# Patient Record
Sex: Female | Born: 1985 | Race: Black or African American | Hispanic: No | Marital: Married | Smoking: Never smoker
Health system: Southern US, Community
[De-identification: ages and names within clinical notes are randomized; demographics above are authoritative.]

## PROBLEM LIST (undated history)

## (undated) HISTORY — PX: TONSILLECTOMY: SUR1361

---

## 2012-12-15 ENCOUNTER — Encounter: Payer: Self-pay | Admitting: Obstetrics and Gynecology

## 2012-12-15 ENCOUNTER — Ambulatory Visit: Payer: BC Managed Care – PPO | Admitting: Obstetrics and Gynecology

## 2012-12-15 VITALS — BP 120/84 | HR 60 | Ht 68.25 in | Wt 185.0 lb

## 2012-12-15 DIAGNOSIS — Z124 Encounter for screening for malignant neoplasm of cervix: Secondary | ICD-10-CM

## 2012-12-15 DIAGNOSIS — Z113 Encounter for screening for infections with a predominantly sexual mode of transmission: Secondary | ICD-10-CM

## 2012-12-15 NOTE — Progress Notes (Signed)
Last Pap: 03/2011 per pt WNL: Pt unsure of results  Regular Periods:no Contraception: withdrawal   Monthly Breast exam:yes Tetanus<23yrs:yes Nl.Bladder Function:yes Daily BMs:yes Healthy Diet:yes Calcium:no Mammogram:no Date of Mammogram: n/a Exercise:yes Have often Exercise: walking daily  Seatbelt: yes Abuse at home: no Stressful work:no Sigmoid-colonoscopy: n/a Bone Density: No PCP: Pt does not have one Change in PMH: n/a Change in FMH:n/a BP 120/84  Pulse 60  Ht 5' 8.25" (1.734 m)  Wt 185 lb (83.915 kg)  BMI 27.92 kg/m2  LMP 09/14/2012 Pt with complaints:no and she is interested in [facility].  No VTE, GB or liver disease.  She used ocps as a teen without problems.  She has a menses q 3 months.  No menopausal sxs.    Physical Examination: General appearance - alert, well appearing, and in no distress Mental status - normal mood, behavior, speech, dress, motor activity, and thought processes Neck - supple, no significant adenopathy,  thyroid exam: thyroid is normal in size without nodules or tenderness Chest - clear to auscultation, no wheezes, rales or rhonchi, symmetric air entry Heart - normal rate and regular rhythm Abdomen - soft, nontender, nondistended, no masses or organomegaly Breasts - breasts appear normal, no suspicious masses, no skin or nipple changes or axillary nodes Pelvic - normal external genitalia, vulva, vagina, cervix, uterus and adnexa Rectal - rectal exam not indicated Back exam - full range of motion, no tenderness, palpable spasm or pain on motion Neurological - alert, oriented, normal speech, no focal findings or movement disorder noted Musculoskeletal - no joint tenderness, deformity or swelling Extremities - no edema, redness or tenderness in the calves or thighs Skin - normal coloration and turgor, no rashes, no suspicious skin lesions noted Routine exam Pap sent yes Mammogram due no pt desires continuous BC.  sesonique used for contraception.   Verbal and written instrucitons given RT 4 months

## 2012-12-16 LAB — HIV ANTIBODY (ROUTINE TESTING W REFLEX): HIV: NONREACTIVE

## 2012-12-17 LAB — PAP IG, CT-NG, RFX HPV ASCU: Chlamydia Probe Amp: NEGATIVE

## 2012-12-23 ENCOUNTER — Telehealth: Payer: Self-pay

## 2012-12-23 NOTE — Telephone Encounter (Signed)
Message copied by Rolla Plate on Wed Dec 23, 2012 10:05 AM ------      Message from: Jaymes Graff      Created: Wed Dec 23, 2012 12:33 AM       Please schedule pt for colposcopy. ------

## 2012-12-23 NOTE — Telephone Encounter (Signed)
Spoke with pt rgd labs informed lab results informed pap showed abnl pap need colpo pt has appt 01/06/13 at 2:45 with nd pt voice understanding

## 2013-01-06 ENCOUNTER — Encounter: Payer: BC Managed Care – PPO | Admitting: Obstetrics and Gynecology

## 2013-01-06 ENCOUNTER — Telehealth: Payer: Self-pay | Admitting: Obstetrics and Gynecology

## 2013-01-06 NOTE — Telephone Encounter (Signed)
Forwarded to appts.

## 2013-02-01 ENCOUNTER — Telehealth: Payer: Self-pay

## 2013-02-01 NOTE — Telephone Encounter (Signed)
Message copied by Rolla Plate on Mon Feb 01, 2013 12:20 PM ------      Message from: Jaymes Graff      Created: Mon Feb 01, 2013  9:39 AM       Please schedule pt for colposcopy. ------

## 2013-02-01 NOTE — Telephone Encounter (Signed)
Try calling pt rgd r/s colpo no answer unable to leave msg 

## 2013-02-02 NOTE — Telephone Encounter (Signed)
Try calling pt rgd r/s colpo and lab results no answer unable to leave voice mail

## 2013-02-03 ENCOUNTER — Telehealth: Payer: Self-pay

## 2013-02-03 NOTE — Telephone Encounter (Signed)
Try calling pt rgd r/s colpo no answer unable to leave msg

## 2013-12-22 ENCOUNTER — Emergency Department (HOSPITAL_COMMUNITY): Payer: BC Managed Care – PPO

## 2013-12-22 ENCOUNTER — Emergency Department (HOSPITAL_COMMUNITY)
Admission: EM | Admit: 2013-12-22 | Discharge: 2013-12-22 | Disposition: A | Discharge status: Home / Self Care | Payer: BC Managed Care – PPO | Attending: Emergency Medicine | Admitting: Emergency Medicine

## 2013-12-22 ENCOUNTER — Encounter (HOSPITAL_COMMUNITY): Payer: Self-pay | Admitting: Emergency Medicine

## 2013-12-22 DIAGNOSIS — J189 Pneumonia, unspecified organism: Secondary | ICD-10-CM

## 2013-12-22 DIAGNOSIS — J159 Unspecified bacterial pneumonia: Secondary | ICD-10-CM | POA: Insufficient documentation

## 2013-12-22 MED ORDER — LEVOFLOXACIN 500 MG PO TABS: 500.0000 mg | ORAL_TABLET | Freq: Every day | ORAL | Status: AC

## 2013-12-22 MED ORDER — HYDROCODONE-HOMATROPINE 5-1.5 MG/5ML PO SYRP: 5.0000 mL | ORAL_SOLUTION | Freq: Four times a day (QID) | ORAL | Status: AC | PRN

## 2013-12-22 NOTE — Discharge Instructions (Signed)
Take Levaquin as directed until gone. Take hycodan as needed for cough. Refer to attached documents for more information. Return to the ED with worsening or concerning symptoms.  °

## 2013-12-22 NOTE — ED Provider Notes (Signed)
CSN: [id number]     Arrival date & time 12/22/13  1515 History  This chart was scribed for Yvonne Shadow, PA-C, working with Yvonne Manifold, MD by Maree Erie, ED Scribe. This patient was seen in room [employer] and the patient's care was started at 3:56 PM.    Chief Complaint  Patient presents with  . Cough    Patient is a 28 y.o. female presenting with cough. The history is provided by the patient. No language interpreter was used.  Cough Associated symptoms: chills, fever and shortness of breath   Associated symptoms: no sore throat     HPI Comments: Yvonne Sanchez is a 28 y.o. female who presents to the Emergency Department complaining of a intermittent coughing spells that began a week ago. She states that she has associated subjective fever, chills and shortness of breath with the coughing spells. She has been using Mucinex and Vick's at home with mild relief. She denies nausea, vomiting, diarrhea, or sore throat. She states that she works at an [employer] so she has been around a lot of sick contacts.   History reviewed. No pertinent past medical history. Past Surgical History  Procedure Laterality Date  . Tonsillectomy     Family History  Problem Relation Age of Onset  . Ovarian cysts Mother    History  Substance Use Topics  . Smoking status: Never Smoker   . Smokeless tobacco: Not on file  . Alcohol Use: No   OB History   Grav Para Term Preterm Abortions TAB SAB Ect Mult Living   0 0 0 0 0 0 0 0 0 0      Review of Systems  Constitutional: Positive for fever and chills.  HENT: Negative for sore throat.   Respiratory: Positive for cough and shortness of breath.   Gastrointestinal: Negative for nausea, vomiting and diarrhea.  All other systems reviewed and are negative.      Allergies  Review of patient's allergies indicates no known allergies.  Home Medications  No current outpatient prescriptions on file. Triage Vitals: BP 115/84  Pulse 95   Temp(Src) 99.1 F (37.3 C) (Oral)  Resp 20  SpO2 94%  Physical Exam  Nursing note and vitals reviewed. Constitutional: She is oriented to person, place, and time. She appears well-developed and well-nourished. No distress.  HENT:  Head: Normocephalic and atraumatic.  Eyes: EOM are normal.  Neck: Neck supple. No tracheal deviation present.  Cardiovascular: Normal rate, regular rhythm and normal heart sounds.   No murmur heard. Pulmonary/Chest: Effort normal and breath sounds normal. No respiratory distress. She has no wheezes. She has no rales.  Abdominal: Soft. She exhibits no distension. There is no tenderness.  Musculoskeletal: Normal range of motion.  Neurological: She is alert and oriented to person, place, and time.  Skin: Skin is warm and dry.  Psychiatric: She has a normal mood and affect. Her behavior is normal.    ED Course  Procedures (including critical care time)  DIAGNOSTIC STUDIES: Oxygen Saturation is 94% on room air, adequate by my interpretation.    COORDINATION OF CARE: 3:58 PM -Will order chest x-ray. Patient verbalizes understanding and agrees with treatment plan.    Labs Review Labs Reviewed - No data to display Imaging Review Dg Chest 2 View  12/22/2013   CLINICAL DATA:  Cough, chills, possible pneumonia  EXAM: CHEST  2 VIEW  COMPARISON:  None.  FINDINGS: Cardiomediastinal silhouette is unremarkable. No pulmonary edema. Data is streaky airspace disease left base retrocardiac  highly suspicious for infiltrate/ pneumonia. Follow-up to resolution is recommended. Mild thoracic dextroscoliosis.  IMPRESSION: Streaky airspace disease left base retrocardiac highly suspicious for infiltrate/ pneumonia. Follow-up to resolution after treatment is recommended.   Electronically Signed   By: Lahoma Crocker M.D.   On: 12/22/2013 16:17    EKG Interpretation   None       MDM   Final diagnoses:  CAP (community acquired pneumonia)    Patient's chest xray shows  infiltrate concerning for pneumonia. Patient will be treated with levaquin and hycodan. Vital stable and patient afebrile.   I personally performed the services described in this documentation, which was scribed in my presence. The recorded information has been reviewed and is accurate.    Alvina Chou, PA-C 12/23/13 1052

## 2013-12-22 NOTE — ED Notes (Signed)
Pt c/o cough, chills, shortness of breath for about one week.  Pt states she has had productive cough. Pt denies other symptoms. Pt with no acute distress. Skin warm, dry.

## 2013-12-27 NOTE — ED Provider Notes (Signed)
Medical screening examination/treatment/procedure(s) were performed by non-physician practitioner and as supervising physician I was immediately available for consultation/collaboration.  EKG Interpretation   None        Virgel Manifold, MD 12/27/13 1426

## 2016-01-23 IMAGING — CR DG CHEST 2V
2 series · 2 of 2 positions shown · non-contrast
Comparison: None.

CLINICAL DATA: Cough, chills, possible pneumonia

EXAM:
CHEST  2 VIEW

[w chest pa]
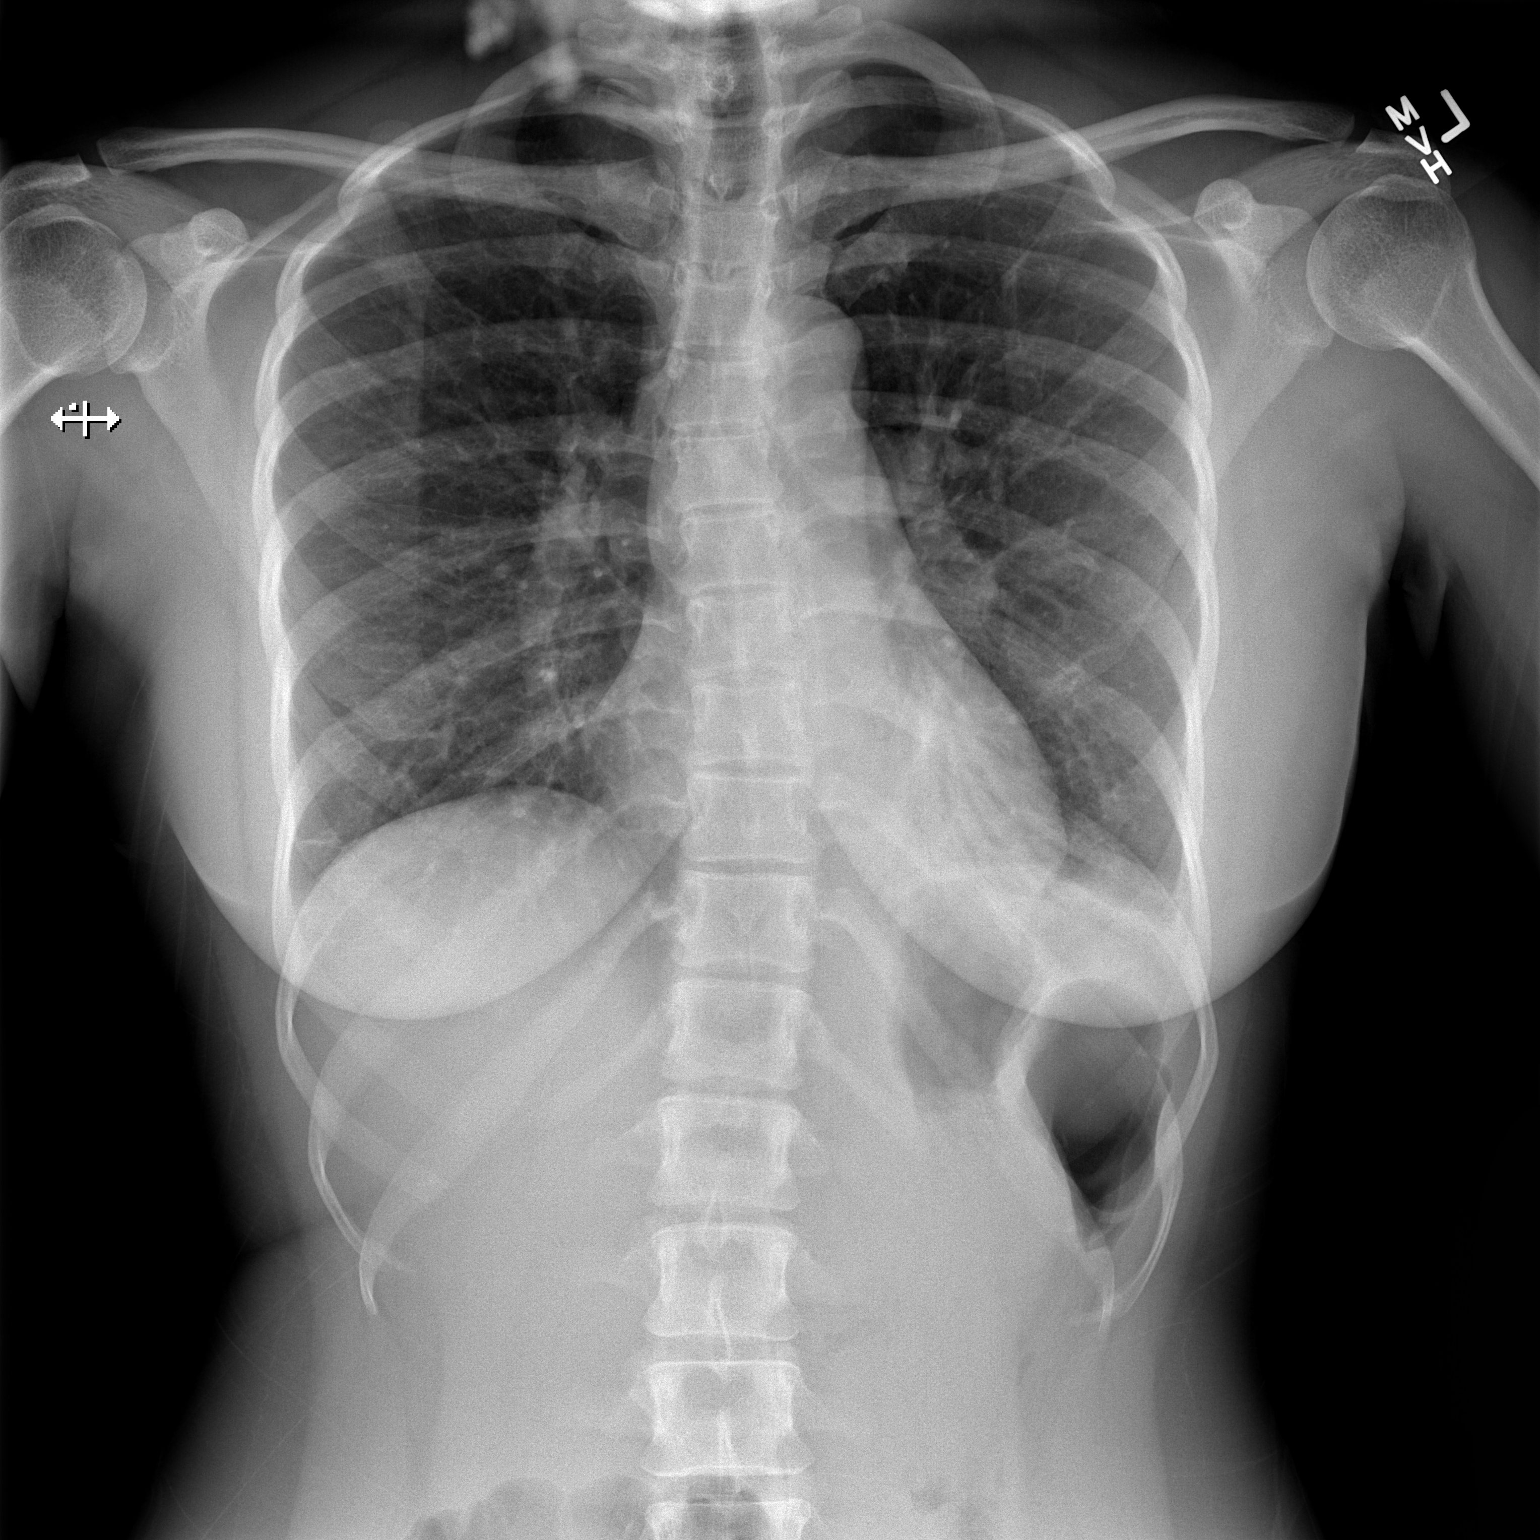

[w chest lat]
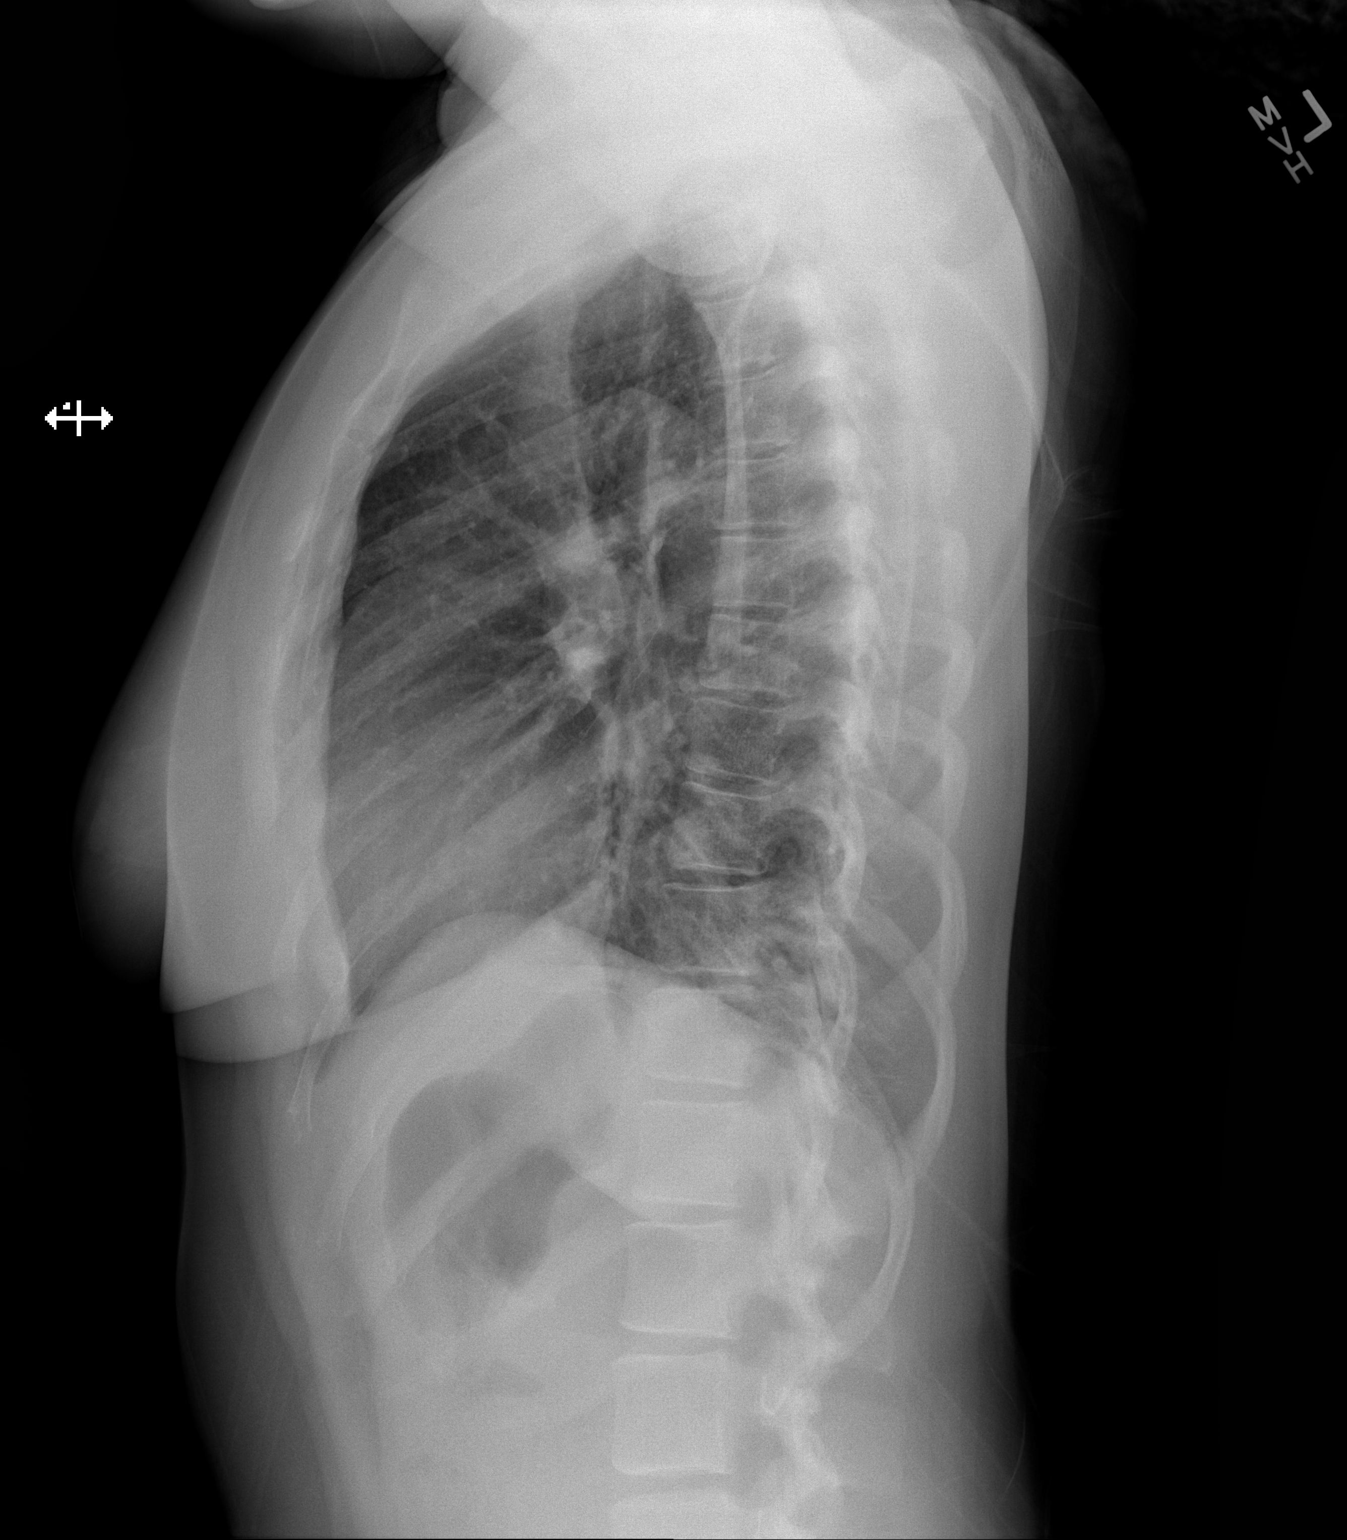

[2 of 2 positions shown; findings below may reference images not displayed]

FINDINGS: Cardiomediastinal silhouette is unremarkable. No pulmonary edema.
Data is streaky airspace disease left base retrocardiac highly
suspicious for infiltrate/ pneumonia. Follow-up to resolution is
recommended. Mild thoracic dextroscoliosis.
IMPRESSION: Streaky airspace disease left base retrocardiac highly suspicious
for infiltrate/ pneumonia. Follow-up to resolution after treatment
is recommended.

## 2017-08-29 ENCOUNTER — Other Ambulatory Visit: Payer: Self-pay | Admitting: Obstetrics and Gynecology

## 2017-08-29 DIAGNOSIS — E041 Nontoxic single thyroid nodule: Secondary | ICD-10-CM

## 2017-09-30 ENCOUNTER — Other Ambulatory Visit: Payer: BC Managed Care – PPO

## 2017-10-07 ENCOUNTER — Other Ambulatory Visit: Payer: BC Managed Care – PPO

## 2018-06-16 ENCOUNTER — Ambulatory Visit
Admission: RE | Admit: 2018-06-16 | Discharge: 2018-06-16 | Disposition: A | Discharge status: Home / Self Care | Payer: BC Managed Care – PPO | Source: Ambulatory Visit | Attending: Obstetrics and Gynecology | Admitting: Obstetrics and Gynecology

## 2018-06-16 DIAGNOSIS — E041 Nontoxic single thyroid nodule: Secondary | ICD-10-CM

## 2018-06-25 ENCOUNTER — Other Ambulatory Visit: Payer: Self-pay | Admitting: Obstetrics and Gynecology

## 2018-06-25 DIAGNOSIS — E041 Nontoxic single thyroid nodule: Secondary | ICD-10-CM

## 2018-06-26 ENCOUNTER — Other Ambulatory Visit: Payer: Self-pay | Admitting: Obstetrics and Gynecology

## 2018-06-26 DIAGNOSIS — E041 Nontoxic single thyroid nodule: Secondary | ICD-10-CM

## 2018-07-02 ENCOUNTER — Inpatient Hospital Stay
Admission: RE | Admit: 2018-07-02 | Discharge: 2018-07-02 | Disposition: A | Discharge status: Home / Self Care | Payer: BC Managed Care – PPO | Source: Ambulatory Visit | Attending: Obstetrics and Gynecology | Admitting: Obstetrics and Gynecology

## 2018-07-02 ENCOUNTER — Other Ambulatory Visit: Payer: BC Managed Care – PPO

## 2018-07-28 ENCOUNTER — Ambulatory Visit
Admission: RE | Admit: 2018-07-28 | Discharge: 2018-07-28 | Disposition: A | Discharge status: Home / Self Care | Payer: BC Managed Care – PPO | Source: Ambulatory Visit | Attending: Obstetrics and Gynecology | Admitting: Obstetrics and Gynecology

## 2018-07-28 ENCOUNTER — Other Ambulatory Visit (HOSPITAL_COMMUNITY)
Admission: RE | Admit: 2018-07-28 | Discharge: 2018-07-28 | Disposition: A | Discharge status: Home / Self Care | Payer: BC Managed Care – PPO | Source: Ambulatory Visit | Attending: Student | Admitting: Student

## 2018-07-28 DIAGNOSIS — E041 Nontoxic single thyroid nodule: Secondary | ICD-10-CM

## 2018-12-25 ENCOUNTER — Other Ambulatory Visit: Payer: Self-pay | Admitting: Surgery

## 2018-12-25 DIAGNOSIS — E041 Nontoxic single thyroid nodule: Secondary | ICD-10-CM

## 2019-06-12 IMAGING — US US THYROID
1 series · 13 of 25 positions shown · non-contrast
Comparison: None.

CLINICAL DATA: Thyroid nodule.

EXAM:
THYROID ULTRASOUND
TECHNIQUE: Ultrasound examination of the thyroid gland and adjacent soft
tissues was performed.

[Series 1: us thyroid · 0.06mm/px · 13 of 51 slices shown]
[im 1/51]
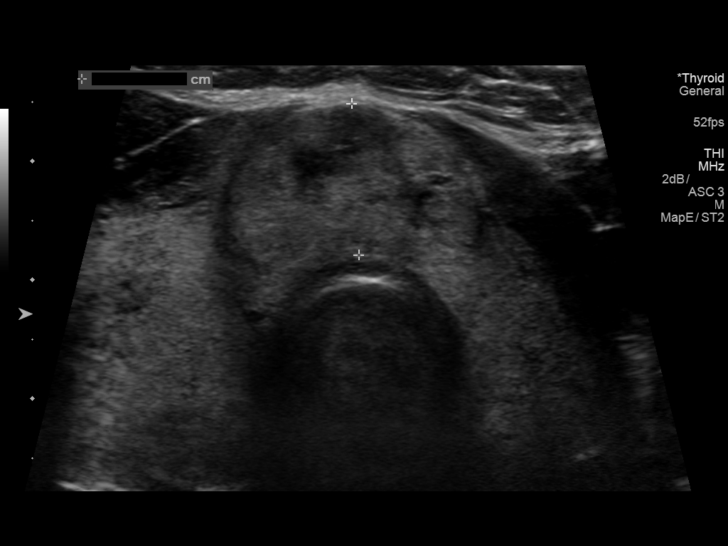
[im 5/51]
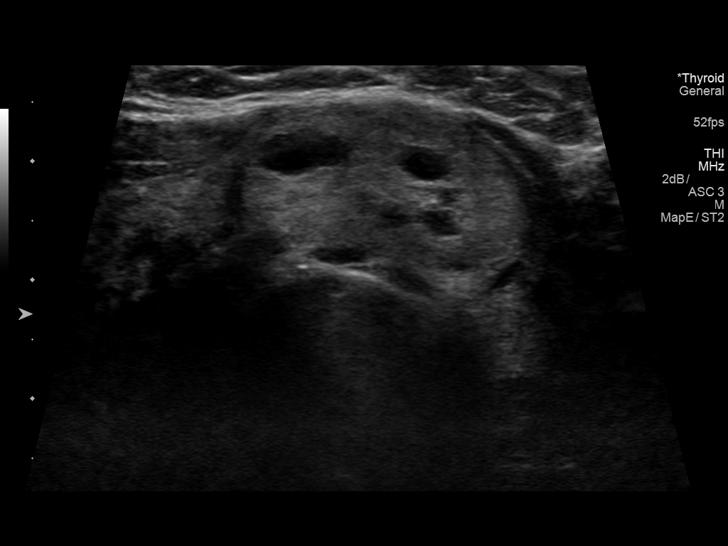
[im 9/51]
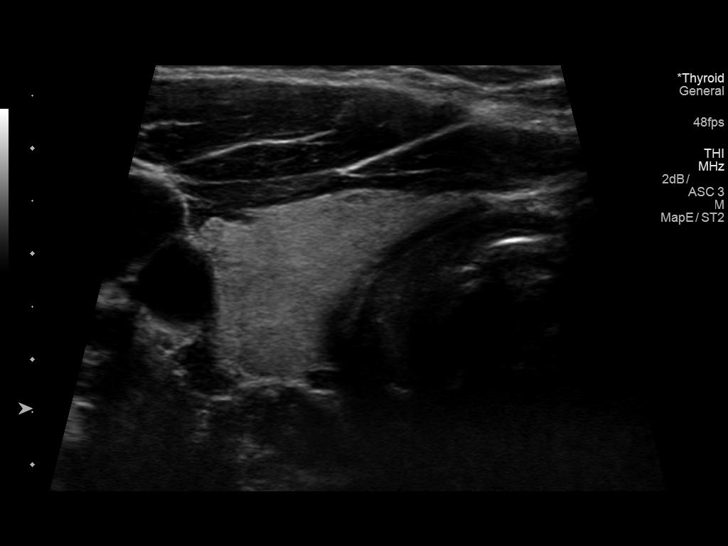
[im 13/51]
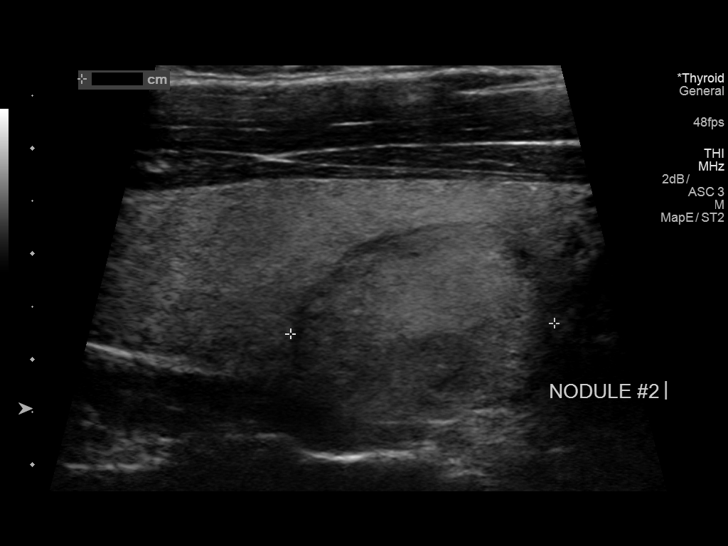
[im 17/51]
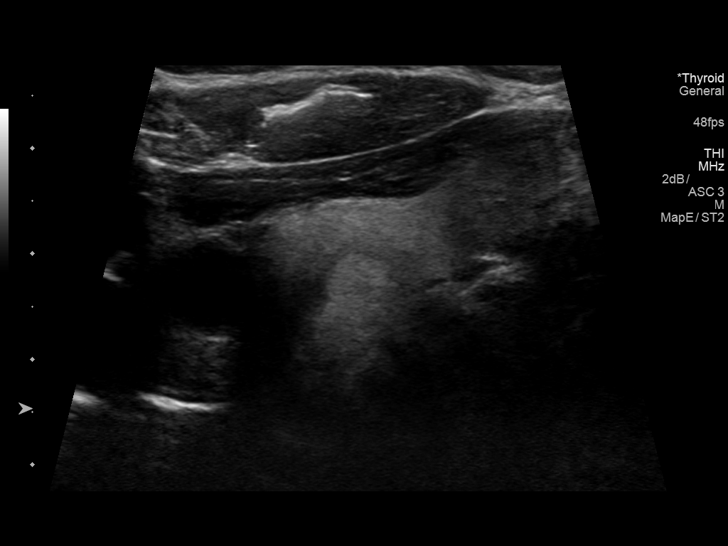
[im 21/51]
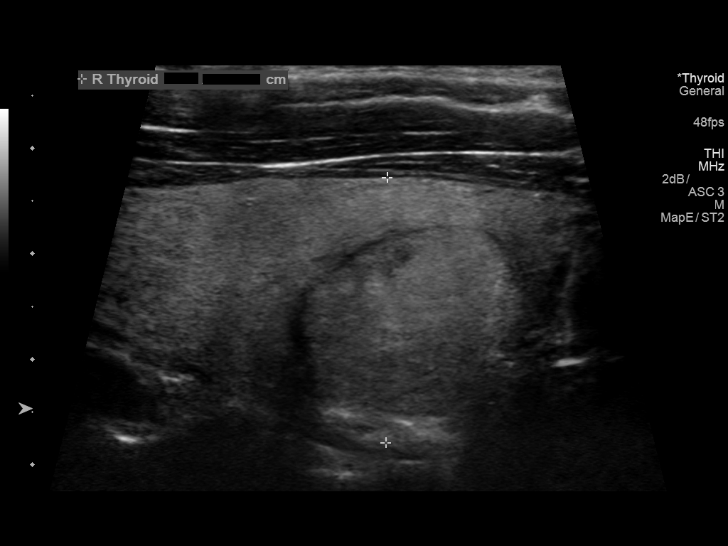
[im 26/51]
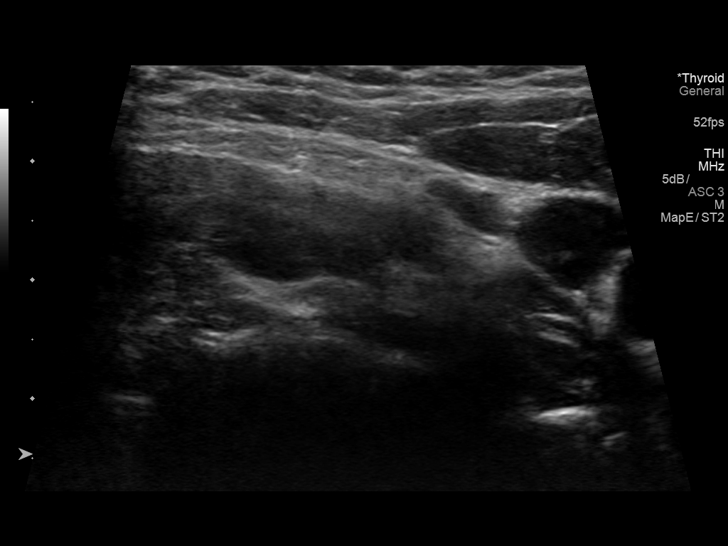
[im 30/51]
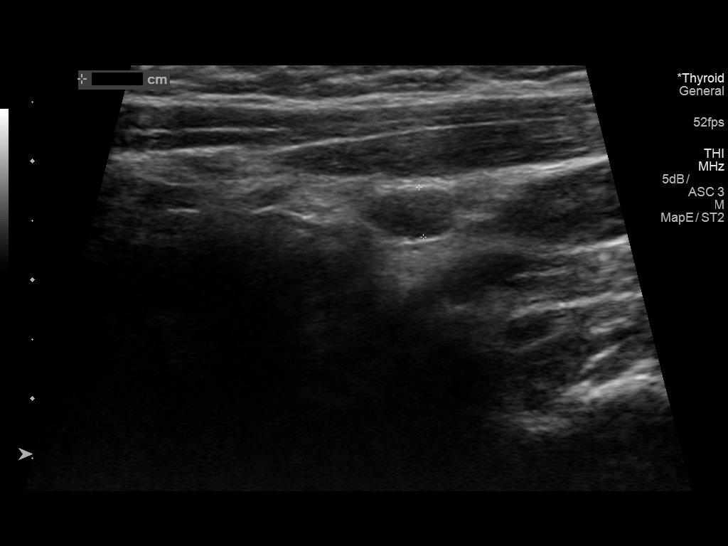
[im 34/51]
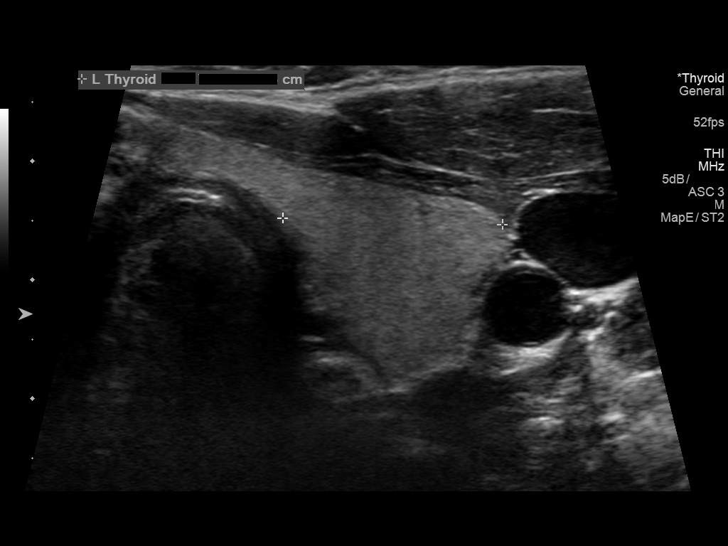
[im 38/51]
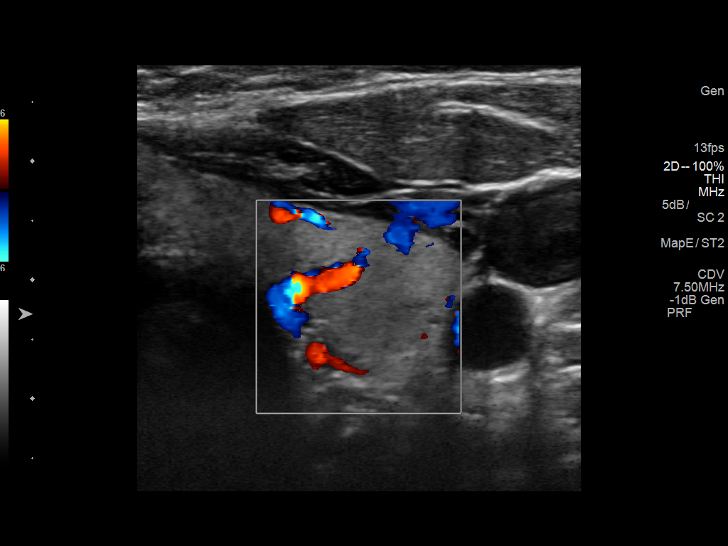
[im 42/51]
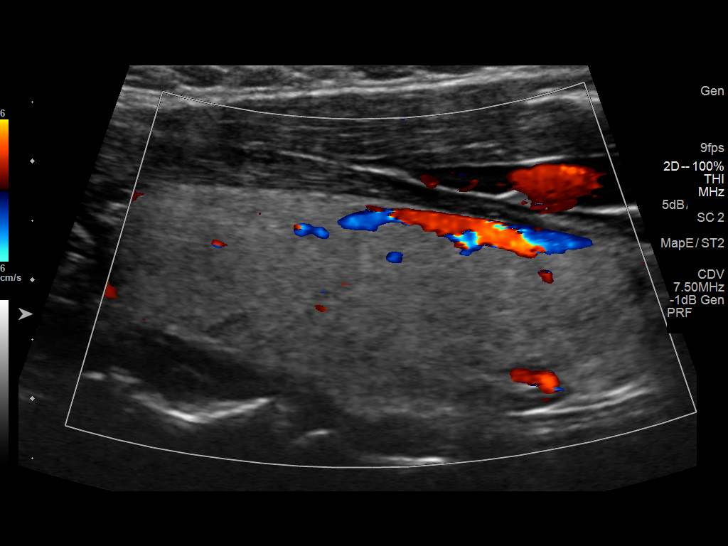
[im 46/51]
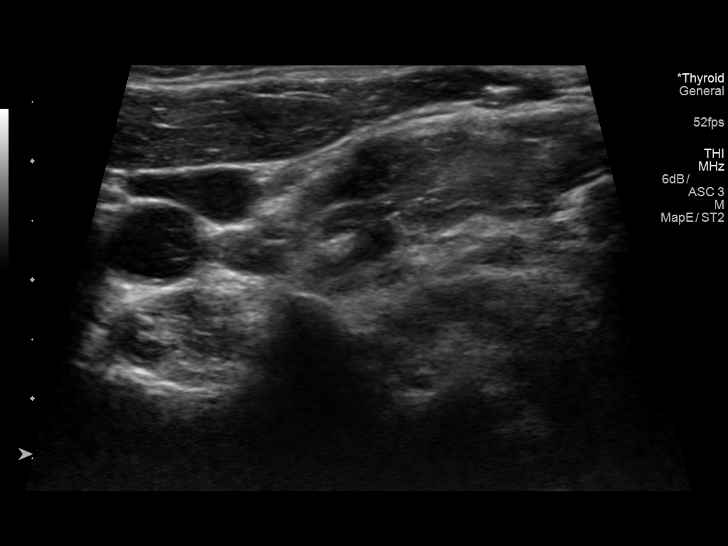
[im 51/51]
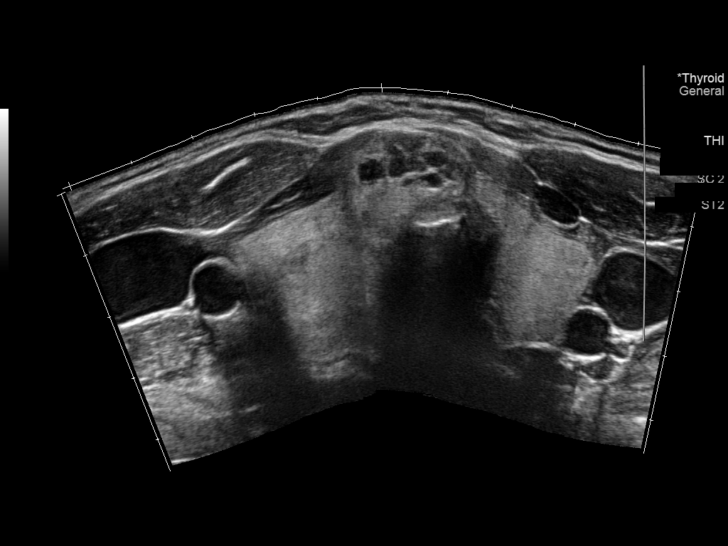

[13 of 25 positions shown; findings below may reference images not displayed]

FINDINGS: Parenchymal Echotexture: Mildly heterogenous

Isthmus: 1.3 cm

Right lobe: 5.5 x 2.5 x 2.3 cm

Left lobe: 4.7 x 1.6 x 1.9 cm

_________________________________________________________

Estimated total number of nodules >/= 1 cm: 2

Number of spongiform nodules >/=  2 cm not described below (TR1): 0

Number of mixed cystic and solid nodules >/= 1.5 cm not described
below (TR2): 0

_________________________________________________________

Nodule # 1:

Location: Isthmus; Mid

Maximum size: 2.6 cm; Other 2 dimensions: 1.2 x 2.2 cm

Composition: solid/almost completely solid (2); nodule has small
cystic components.

Echogenicity: isoechoic (1)

Shape: not taller-than-wide (0)

Margins: smooth (0)

Echogenic foci: none (0)

ACR TI-RADS total points: 3.

ACR TI-RADS risk category: TR3 (3 points).

ACR TI-RADS recommendations:

**Given size (>/= 2.5 cm) and appearance, fine needle aspiration of
this mildly suspicious nodule should be considered based on TI-RADS
criteria.

_________________________________________________________

Nodule # 2:

Location: Right; Mid

Maximum size: 2.5 cm; Other 2 dimensions: 1.7 x 1.8 cm

Composition: solid/almost completely solid (2)

Echogenicity: isoechoic (1)

Shape: not taller-than-wide (0)

Margins: ill-defined (0)

Echogenic foci: none (0)

ACR TI-RADS total points: 3.

ACR TI-RADS risk category: TR3 (3 points).

ACR TI-RADS recommendations:

**Given size (>/= 2.5 cm) and appearance, fine needle aspiration of
this mildly suspicious nodule should be considered based on TI-RADS
criteria.

_________________________________________________________

No discrete left thyroid nodules.
IMPRESSION: Isthmus nodule measures up to 2.6 cm and right thyroid nodule
measures up to 2.5 cm. Both of these nodules meet criteria for
ultrasound-guided biopsy.

The above is in keeping with the ACR TI-RADS recommendations - [HOSPITAL] 4391;[DATE].

## 2019-07-24 IMAGING — US US FNA BIOPSY THYROID 1ST LESION
1 series · 13 of 25 positions shown · non-contrast
Comparison: US THYROID 06/16/2018

MEDICATIONS:
3 mL 1% lidocaine

COMPLICATIONS:
None immediate.

INDICATION: Indeterminate thyroid nodules of the isthmus and right thyroid.
Request made for fine need aspiration of indeterminate thyroid
nodules.

EXAM:
ULTRASOUND GUIDED FINE NEEDLE ASPIRATION OF INDETERMINATE THYROID
NODULE
TECHNIQUE: Informed written consent was obtained from the patient after a
discussion of the risks, benefits and alternatives to treatment.
Questions regarding the procedure were encouraged and answered. A
timeout was performed prior to the initiation of the procedure.

[Series 1: us fna biopsy thyroid 1st lesion · 0.06mm/px · 27 acquisitions, 13 frames shown]
[im 1/27]
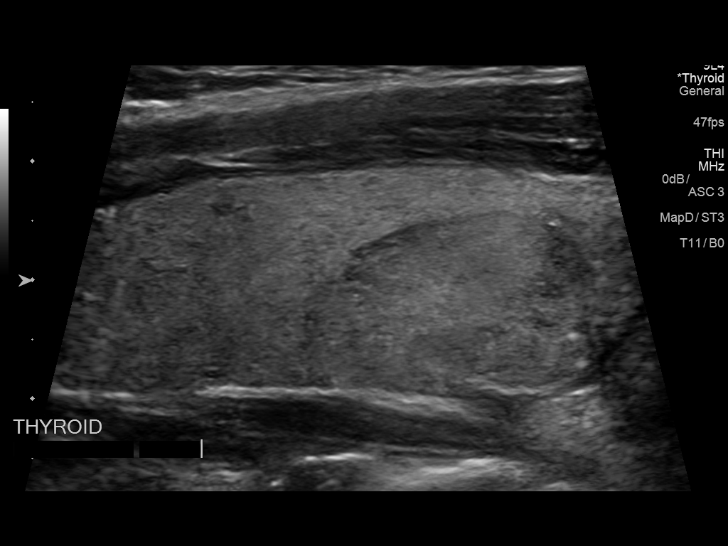
[im 3/27]
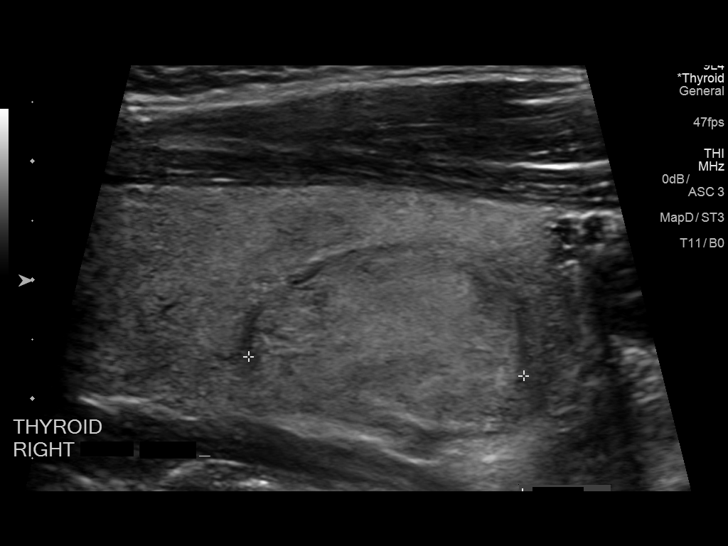
[im 5/27]
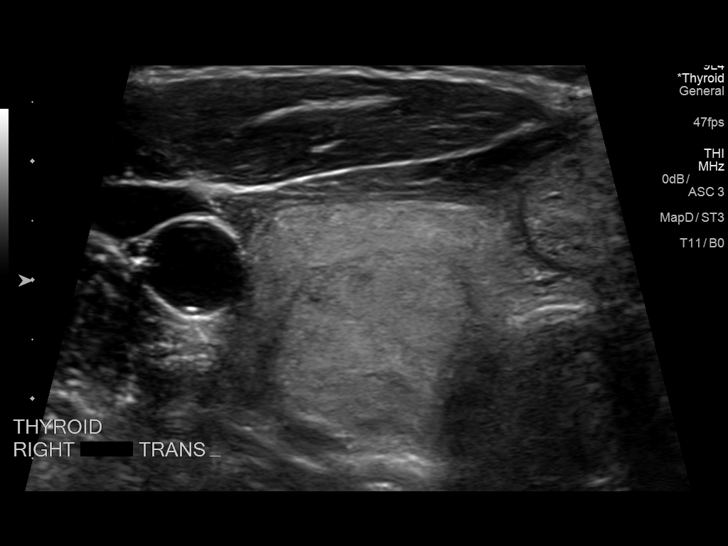
[im 7/27]
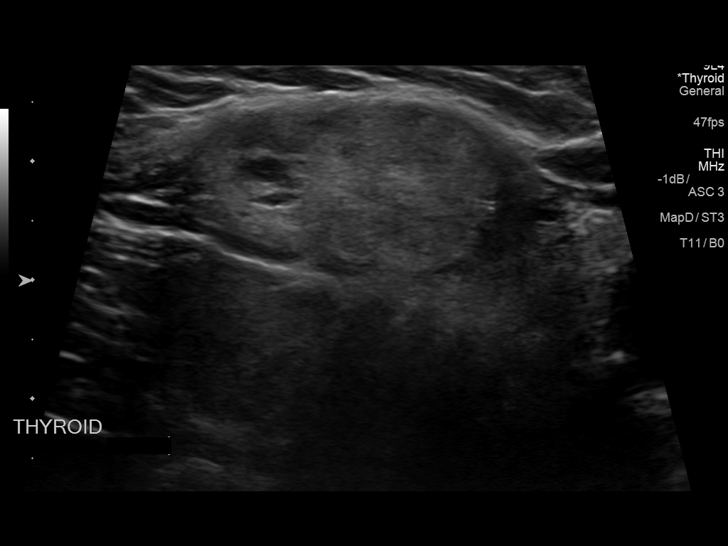
[im 9/27]
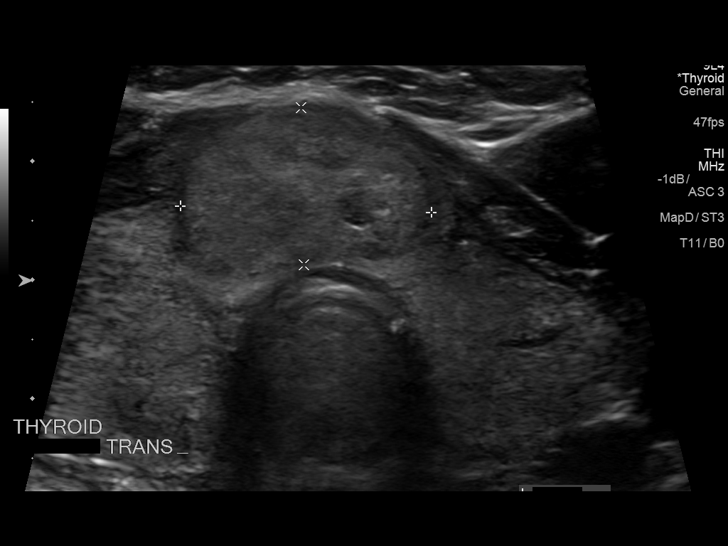
[im 11/27]
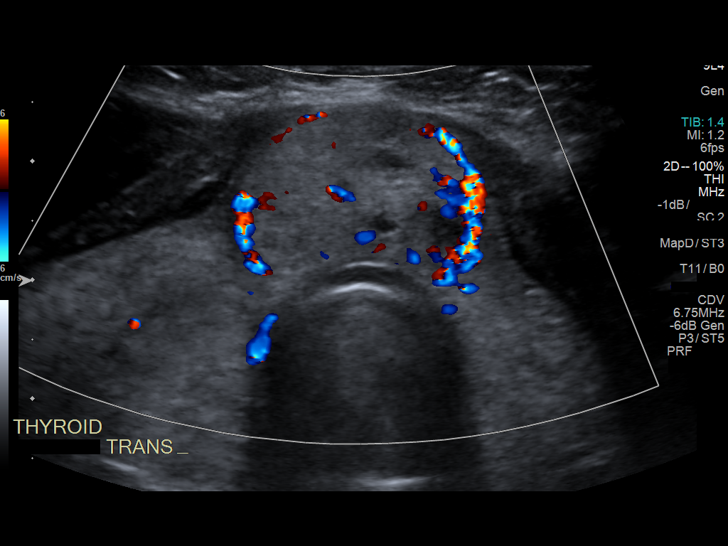
[im 14/27]
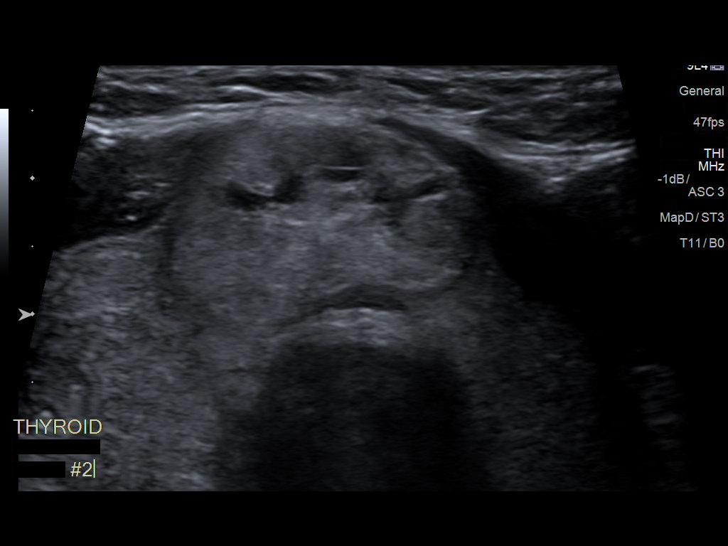
[im 16/27]
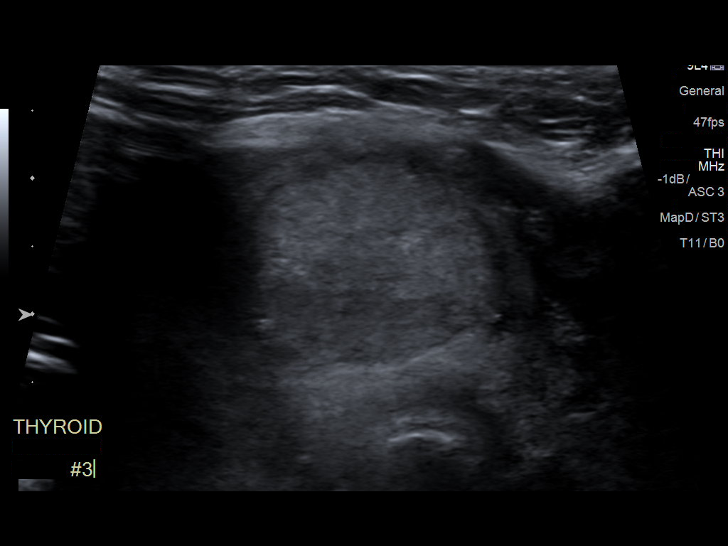
[im 18/27]
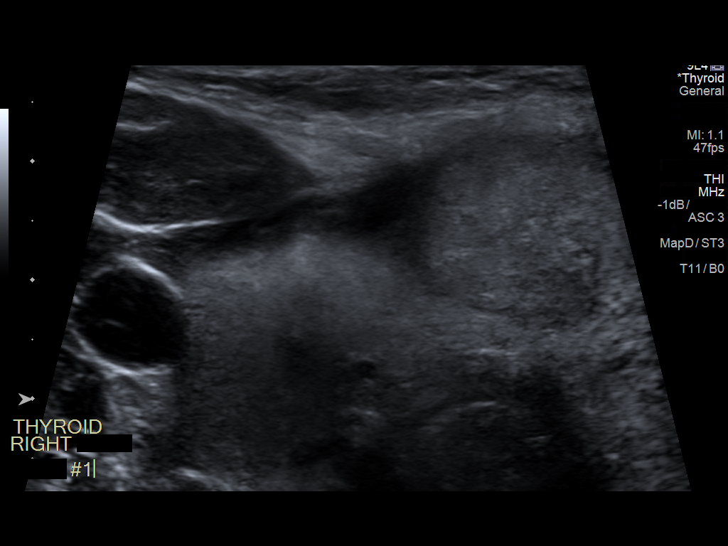
[im 20/27]
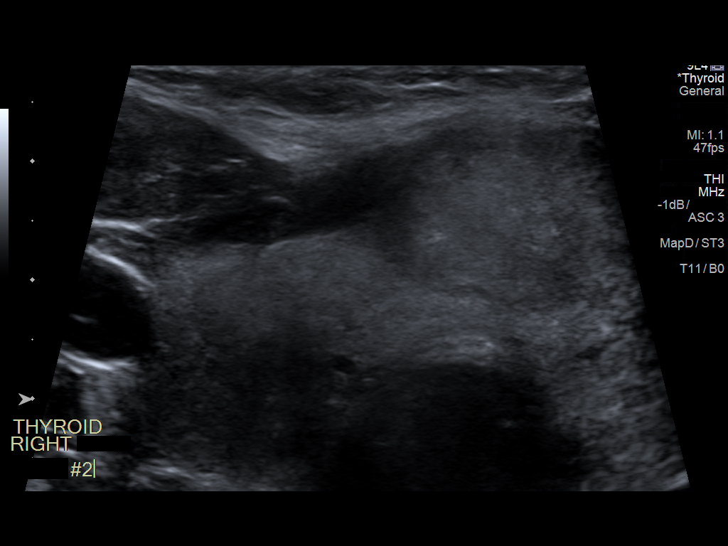
[im 22/27]
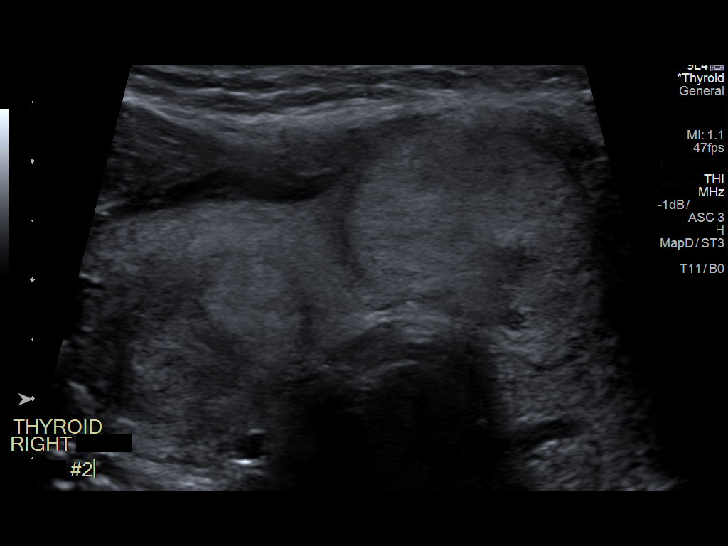
[im 24/27]
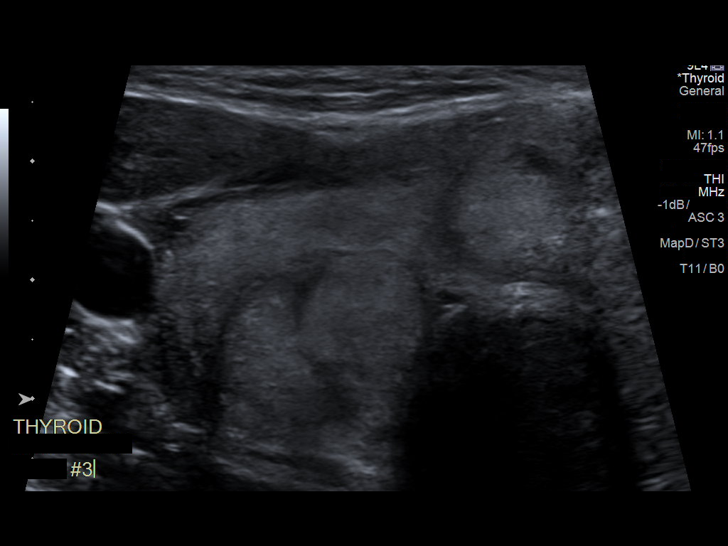
[im 27/27]
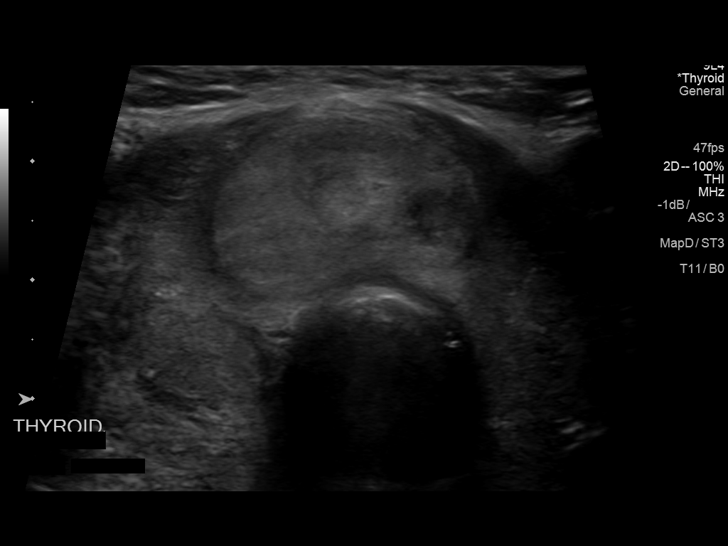

[13 of 25 positions shown; findings below may reference images not displayed]

Pre-procedural ultrasound scanning demonstrated unchanged size and
appearance of the indeterminate nodules within the isthmus and right
thyroid.

The procedure was planned. The neck was prepped in the usual sterile
fashion, and a sterile drape was applied covering the operative
field. A timeout was performed prior to the initiation of the
procedure. Local anesthesia was provided with 1% lidocaine.

Under direct ultrasound guidance, 3 FNA biopsies were performed of
the isthmus nodule with a 25 gauge needle. Multiple ultrasound
images were saved for procedural documentation purposes. The samples
were prepared and submitted to pathology.

Under direct ultrasound guidance, 3 FNA biopsies were performed of
the right thyroid nodule with a 25 gauge needle. Multiple ultrasound
images were saved for procedural documentation purposes. The samples
were prepared and submitted to pathology.

Limited post procedural scanning was negative for hematoma or
additional complication. Dressings were placed. The patient
tolerated the above procedures procedure well without immediate
postprocedural complication.
FINDINGS: Nodule reference number based on prior diagnostic ultrasound: 1

Maximum size: 2.6 cm

Location: Isthmus

ACR TI-RADS risk category: TR3 (3 points)

Reason for biopsy: meets ACR TI-RADS criteria

_________________________________________________________

Nodule reference number based on prior diagnostic ultrasound: 2

Maximum size: 2.5 cm

Location: Right; Mid

ACR TI-RADS risk category: TR3 (3 points)

Reason for biopsy: meets ACR TI-RADS criteria

Ultrasound imaging confirms appropriate placement of the needles
within the thyroid nodule.
IMPRESSION: 1. Technically successful ultrasound guided fine needle aspiration
of indeterminate nodule of the isthmus.
2. Technically successful ultrasound guided fine needle aspiration
of indeterminate nodule of the right mid thyroid.

## 2022-11-22 ENCOUNTER — Other Ambulatory Visit: Payer: Self-pay | Admitting: Surgery

## 2022-11-22 DIAGNOSIS — E042 Nontoxic multinodular goiter: Secondary | ICD-10-CM

## 2022-11-22 DIAGNOSIS — D440 Neoplasm of uncertain behavior of thyroid gland: Secondary | ICD-10-CM

## 2022-12-11 ENCOUNTER — Inpatient Hospital Stay: Admission: RE | Admit: 2022-12-11 | Payer: BC Managed Care – PPO | Source: Ambulatory Visit

## 2022-12-27 ENCOUNTER — Other Ambulatory Visit: Payer: BC Managed Care – PPO
